# Patient Record
Sex: Female | Born: 1985 | Race: Black or African American | Hispanic: No | Marital: Married | Smoking: Never smoker
Health system: Southern US, Community
[De-identification: ages and names within clinical notes are randomized; demographics above are authoritative.]

## PROBLEM LIST (undated history)

## (undated) HISTORY — PX: CHOLECYSTECTOMY: SHX55

---

## 2009-11-17 ENCOUNTER — Emergency Department (HOSPITAL_BASED_OUTPATIENT_CLINIC_OR_DEPARTMENT_OTHER): Admission: EM | Admit: 2009-11-17 | Discharge: 2009-11-17 | Payer: Self-pay | Admitting: Emergency Medicine

## 2011-09-23 ENCOUNTER — Emergency Department (INDEPENDENT_AMBULATORY_CARE_PROVIDER_SITE_OTHER): Payer: Self-pay

## 2011-09-23 ENCOUNTER — Encounter: Payer: Self-pay | Admitting: Emergency Medicine

## 2011-09-23 ENCOUNTER — Emergency Department (HOSPITAL_BASED_OUTPATIENT_CLINIC_OR_DEPARTMENT_OTHER)
Admission: EM | Admit: 2011-09-23 | Discharge: 2011-09-23 | Disposition: A | Discharge status: Home / Self Care | Payer: Self-pay | Attending: Emergency Medicine | Admitting: Emergency Medicine

## 2011-09-23 DIAGNOSIS — R062 Wheezing: Secondary | ICD-10-CM | POA: Insufficient documentation

## 2011-09-23 DIAGNOSIS — J209 Acute bronchitis, unspecified: Secondary | ICD-10-CM | POA: Insufficient documentation

## 2011-09-23 DIAGNOSIS — R05 Cough: Secondary | ICD-10-CM

## 2011-09-23 DIAGNOSIS — R0602 Shortness of breath: Secondary | ICD-10-CM | POA: Insufficient documentation

## 2011-09-23 DIAGNOSIS — IMO0001 Reserved for inherently not codable concepts without codable children: Secondary | ICD-10-CM | POA: Insufficient documentation

## 2011-09-23 DIAGNOSIS — R0902 Hypoxemia: Secondary | ICD-10-CM

## 2011-09-23 MED ORDER — PREDNISONE 10 MG PO TABS
60.0000 mg | ORAL_TABLET | Freq: Once | ORAL | Status: AC
  Administered 2011-09-23: 60 mg via ORAL
  Filled 2011-09-23: qty 1

## 2011-09-23 MED ORDER — ALBUTEROL SULFATE HFA 108 (90 BASE) MCG/ACT IN AERS
2.0000 | INHALATION_SPRAY | Freq: Once | RESPIRATORY_TRACT | Status: AC
  Administered 2011-09-23: 2 via RESPIRATORY_TRACT
  Filled 2011-09-23: qty 6.7

## 2011-09-23 MED ORDER — AZITHROMYCIN 250 MG PO TABS: 250.0000 mg | ORAL_TABLET | Freq: Every day | ORAL | Status: AC

## 2011-09-23 MED ORDER — ALBUTEROL SULFATE HFA 108 (90 BASE) MCG/ACT IN AERS: 2.0000 | INHALATION_SPRAY | RESPIRATORY_TRACT | Status: DC | PRN

## 2011-09-23 MED ORDER — PREDNISONE 20 MG PO TABS: 40.0000 mg | ORAL_TABLET | Freq: Every day | ORAL | Status: AC

## 2011-09-23 NOTE — ED Notes (Signed)
Pt improved with treatment no wheezing noted benefits and Use of meds reviewed

## 2011-09-23 NOTE — ED Notes (Signed)
MD at bedside.Dr Fredricka Bonine at side care reviewed

## 2011-09-23 NOTE — ED Notes (Signed)
Pt verbalizes care plan well

## 2011-09-23 NOTE — ED Provider Notes (Signed)
History     CSN: [id number] Arrival date & time: 09/23/2011  7:42 AM   First MD Initiated Contact with Patient 09/23/11 [phone]      Chief Complaint  Patient presents with  . Wheezing    cough wheezing and cold last PM    (Consider location/radiation/quality/duration/timing/severity/associated sxs/prior treatment) HPI Comments: The patient is a 25 year old female who presents for evaluation of wheezing and cough which have been gradually progressive over the last 3 days, with a nonproductive cough, but with wheezing, worse at night, causing her to feel short of breath and interfering with her sleep. She denies ear pain, sore throat, nasal congestion, fever, or chills. She has no history of asthma or reactive airway disease.  Patient is a 25 y.o. female presenting with wheezing. The history is provided by the patient.  Wheezing  The current episode started 3 to 5 days ago. The onset was gradual. The problem occurs frequently. The problem has been unchanged. The problem is mild. The symptoms are relieved by nothing. The symptoms are aggravated by nothing. Associated symptoms include cough, shortness of breath and wheezing. Pertinent negatives include no chest pain, no chest pressure, no orthopnea, no fever, no rhinorrhea, no sore throat and no stridor. The cough has no precipitants. The cough is non-productive. Nothing relieves the cough. Nothing worsens the cough. There was no intake of a foreign body. She has not inhaled smoke recently. She has had no prior steroid use. Her past medical history does not include asthma or past wheezing. She has been behaving normally.    History reviewed. No pertinent past medical history.  Past Surgical History  Procedure Date  . Cholecystectomy     History reviewed. No pertinent family history.  History  Substance Use Topics  . Smoking status: Never Smoker   . Smokeless tobacco: Not on file  . Alcohol Use: No    OB History    Grav Para Term  Preterm Abortions TAB SAB Ect Mult Living                  Review of Systems  Constitutional: Positive for fatigue. Negative for fever, chills, diaphoresis, activity change, appetite change and unexpected weight change.  HENT: Negative for hearing loss, ear pain, nosebleeds, congestion, sore throat, facial swelling, rhinorrhea, sneezing, drooling, mouth sores, trouble swallowing, neck pain, neck stiffness, dental problem, voice change, postnasal drip, sinus pressure, tinnitus and ear discharge.   Eyes: Negative for photophobia, pain, discharge, redness and itching.  Respiratory: Positive for cough, shortness of breath and wheezing. Negative for choking, chest tightness and stridor.   Cardiovascular: Negative for chest pain, palpitations, orthopnea and leg swelling.  Gastrointestinal: Negative for nausea, vomiting, abdominal pain, diarrhea, constipation, blood in stool, abdominal distention and anal bleeding.  Genitourinary: Negative for dysuria, urgency, frequency, hematuria, flank pain and difficulty urinating.  Musculoskeletal: Positive for myalgias. Negative for back pain, joint swelling, arthralgias and gait problem.  Skin: Negative for color change, pallor, rash and wound.  Neurological: Negative for dizziness, weakness, light-headedness and headaches.  Hematological: Negative for adenopathy. Does not bruise/bleed easily.  Psychiatric/Behavioral: Negative.     Allergies  Review of patient's allergies indicates no known allergies.  Home Medications  No current outpatient prescriptions on file.  BP 158/98  Pulse 88  Temp 97.6 F (36.4 C)  Resp 24  Wt 188 lb (85.276 kg)  SpO2 94%  Physical Exam  Nursing note and vitals reviewed. Constitutional: She is oriented to person, place, and time. She appears  well-developed and well-nourished. No distress.  HENT:  Head: Normocephalic and atraumatic.  Right Ear: Hearing, tympanic membrane, external ear and ear canal normal.  Left Ear:  Hearing, tympanic membrane, external ear and ear canal normal.  Nose: Nose normal.  Mouth/Throat: Uvula is midline, oropharynx is clear and moist and mucous membranes are normal. No oropharyngeal exudate.  Eyes: Conjunctivae and EOM are normal. Pupils are equal, round, and reactive to light.  Neck: Normal range of motion.  Cardiovascular: Normal rate, regular rhythm, normal heart sounds and intact distal pulses.  Exam reveals no gallop and no friction rub.   No murmur heard. Pulmonary/Chest: Effort normal. No accessory muscle usage. Not tachypneic. No respiratory distress. She has no decreased breath sounds. She has wheezes in the right upper field and the left upper field. She has no rhonchi. She has no rales. She exhibits no tenderness.  Abdominal: Soft. Bowel sounds are normal. She exhibits no distension. There is no tenderness. There is no rebound and no guarding.  Musculoskeletal: Normal range of motion. She exhibits no edema and no tenderness.  Neurological: She is alert and oriented to person, place, and time. She has normal reflexes. No cranial nerve deficit. She exhibits normal muscle tone. Coordination normal.  Skin: Skin is warm and dry. No rash noted. She is not diaphoretic. No erythema. No pallor.  Psychiatric: She has a normal mood and affect. Her behavior is normal. Judgment and thought content normal.    ED Course  Procedures (including critical care time)  Labs Reviewed - No data to display No results found.   No diagnosis found.    MDM  The patient has mild adnexal tori wheezing bilaterally posteriorly on auscultation of the chest. She also has oxygen saturations of 93-94% on room air which seems low for her age and otherwise general condition. With the symptoms of the cough and the wheezing I will treat her with a steroid and beta agonist inhaler for bronchospasm that I think is a an acute bronchitis. I will also obtain a chest x-ray to evaluate for pneumonia. Otherwise  this may be a viral upper respiratory tract infection.       Felisa Bonier, MD 09/23/11 [phone]

## 2011-09-23 NOTE — ED Notes (Signed)
Pt presents with cold symptoms x 3 days with wheezing last PM denies Hx of asthma

## 2012-09-28 ENCOUNTER — Emergency Department (HOSPITAL_BASED_OUTPATIENT_CLINIC_OR_DEPARTMENT_OTHER)
Admission: EM | Admit: 2012-09-28 | Discharge: 2012-09-28 | Disposition: A | Discharge status: Home / Self Care | Payer: Self-pay | Attending: Emergency Medicine | Admitting: Emergency Medicine

## 2012-09-28 ENCOUNTER — Emergency Department (HOSPITAL_BASED_OUTPATIENT_CLINIC_OR_DEPARTMENT_OTHER): Payer: Self-pay

## 2012-09-28 ENCOUNTER — Encounter (HOSPITAL_BASED_OUTPATIENT_CLINIC_OR_DEPARTMENT_OTHER): Payer: Self-pay | Admitting: Emergency Medicine

## 2012-09-28 DIAGNOSIS — R51 Headache: Secondary | ICD-10-CM | POA: Insufficient documentation

## 2012-09-28 DIAGNOSIS — J3489 Other specified disorders of nose and nasal sinuses: Secondary | ICD-10-CM | POA: Insufficient documentation

## 2012-09-28 DIAGNOSIS — R0602 Shortness of breath: Secondary | ICD-10-CM | POA: Insufficient documentation

## 2012-09-28 DIAGNOSIS — J40 Bronchitis, not specified as acute or chronic: Secondary | ICD-10-CM | POA: Insufficient documentation

## 2012-09-28 MED ORDER — ALBUTEROL SULFATE HFA 108 (90 BASE) MCG/ACT IN AERS
INHALATION_SPRAY | RESPIRATORY_TRACT | Status: AC
  Administered 2012-09-28: 2 via RESPIRATORY_TRACT
  Filled 2012-09-28: qty 6.7

## 2012-09-28 MED ORDER — GUAIFENESIN-CODEINE 100-10 MG/5ML PO SYRP: 5.0000 mL | ORAL_SOLUTION | Freq: Three times a day (TID) | ORAL | Status: DC | PRN

## 2012-09-28 MED ORDER — ALBUTEROL SULFATE HFA 108 (90 BASE) MCG/ACT IN AERS
2.0000 | INHALATION_SPRAY | RESPIRATORY_TRACT | Status: DC | PRN
  Administered 2012-09-28: 2 via RESPIRATORY_TRACT

## 2012-09-28 NOTE — Discharge Instructions (Signed)

## 2012-09-28 NOTE — ED Notes (Signed)
Pt c/o cough, wheezing, chest tightness, and headache.  Non productive cough, no known fever.

## 2012-09-28 NOTE — ED Provider Notes (Signed)
History     CSN: [id number]  Arrival date & time 09/28/12  1048   First MD Initiated Contact with Patient 09/28/12 1103      Chief Complaint  Patient presents with  . Cough  . Shortness of Breath  . Pleurisy  . Headache    (Consider location/radiation/quality/duration/timing/severity/associated sxs/prior treatment) HPI Comments: Patient presents with a two-day history of worsening nasal congestion cough and wheezing. She states that she feels short of breath at times. Her cough is nonproductive. She has no known fevers. She denies any nausea or vomiting. Denies any chest pain other than soreness in the Center of her chest with coughing. Denies any leg pain or swelling. She's been taking Benadryl without relief.  Patient is a 26 y.o. female presenting with cough, shortness of breath, and headaches.  Cough Associated symptoms include headaches, shortness of breath and wheezing. Pertinent negatives include no chest pain, no chills and no rhinorrhea.  Shortness of Breath  Associated symptoms include cough, shortness of breath and wheezing. Pertinent negatives include no chest pain, no fever and no rhinorrhea.  Headache  Associated symptoms include shortness of breath. Pertinent negatives include no fever, no nausea and no vomiting.    No past medical history on file.  Past Surgical History  Procedure Date  . Cholecystectomy     No family history on file.  History  Substance Use Topics  . Smoking status: Never Smoker   . Smokeless tobacco: Not on file  . Alcohol Use: No    OB History    Grav Para Term Preterm Abortions TAB SAB Ect Mult Living                  Review of Systems  Constitutional: Positive for fatigue. Negative for fever, chills and diaphoresis.  HENT: Positive for congestion and postnasal drip. Negative for rhinorrhea and sneezing.   Eyes: Negative.   Respiratory: Positive for cough, shortness of breath and wheezing. Negative for chest tightness.     Cardiovascular: Negative for chest pain and leg swelling.  Gastrointestinal: Negative for nausea, vomiting, abdominal pain, diarrhea and blood in stool.  Genitourinary: Negative for frequency, hematuria, flank pain and difficulty urinating.  Musculoskeletal: Negative for back pain and arthralgias.  Skin: Negative for rash.  Neurological: Positive for headaches. Negative for dizziness, speech difficulty, weakness and numbness.    Allergies  Review of patient's allergies indicates no known allergies.  Home Medications   Current Outpatient Rx  Name  Route  Sig  Dispense  Refill  . GUAIFENESIN-CODEINE 100-10 MG/5ML PO SYRP   Oral   Take 5 mLs by mouth 3 (three) times daily as needed for cough.   120 mL   0     BP 126/82  Pulse 97  Temp 98.4 F (36.9 C) (Oral)  Resp 16  Ht 4' (1.219 m)  Wt 194 lb (87.998 kg)  BMI 59.20 kg/m2  SpO2 100%  LMP 09/02/2012  Physical Exam  Constitutional: She is oriented to person, place, and time. She appears well-developed and well-nourished.  HENT:  Head: Normocephalic and atraumatic.  Right Ear: External ear normal.  Left Ear: External ear normal.  Mouth/Throat: Oropharynx is clear and moist.  Eyes: Pupils are equal, round, and reactive to light.  Neck: Normal range of motion. Neck supple.  Cardiovascular: Normal rate, regular rhythm and normal heart sounds.   Pulmonary/Chest: Effort normal. No respiratory distress. She has wheezes (few scattered expiratory wheezes). She has no rales. She exhibits no tenderness.  Mildly decreased breath sounds bilaterally   Abdominal: Soft. Bowel sounds are normal. There is no tenderness. There is no rebound and no guarding.  Musculoskeletal: Normal range of motion. She exhibits no edema.  Lymphadenopathy:    She has no cervical adenopathy.  Neurological: She is alert and oriented to person, place, and time.  Skin: Skin is warm and dry. No rash noted.  Psychiatric: She has a normal mood and  affect.    ED Course  Procedures (including critical care time)  Labs Reviewed - No data to display Dg Chest 2 View  09/28/2012  *RADIOLOGY REPORT*  Clinical Data: Cough and wheezing  CHEST - 2 VIEW  Comparison: Chest radiograph 09/23/2011  Findings: Normal heart, mediastinal, and hilar contours.  Normal pulmonary vascularity.  Lungs well expanded and clear.  Trachea midline.  Approximately 25 degrees convex right curvature of the mid thoracic spine is present.  This curvature is unchanged.  IMPRESSION:  1.  No acute cardiopulmonary disease. 2.  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   Original Report Authenticated By: Britta Mccreedy, M.D.      1. Bronchitis       MDM  Patient is well-appearing with no evidence of pneumonia. Oxygen saturations are normal. Will be treated with an albuterol MDI which she was given here in emergency department and had some improvement with using this. Was also given a prescription for cough medicine. Was advised to followup with her primary care physician or return here as needed for any ongoing or worsening symptoms.        Rolan Bucco, MD 09/28/12 1229

## 2012-09-28 NOTE — ED Notes (Signed)
Patient transported to X-ray 

## 2012-12-04 ENCOUNTER — Encounter (HOSPITAL_BASED_OUTPATIENT_CLINIC_OR_DEPARTMENT_OTHER): Payer: Self-pay | Admitting: *Deleted

## 2012-12-04 ENCOUNTER — Emergency Department (HOSPITAL_BASED_OUTPATIENT_CLINIC_OR_DEPARTMENT_OTHER)
Admission: EM | Admit: 2012-12-04 | Discharge: 2012-12-04 | Disposition: A | Discharge status: Home / Self Care | Payer: Self-pay | Attending: Emergency Medicine | Admitting: Emergency Medicine

## 2012-12-04 DIAGNOSIS — K529 Noninfective gastroenteritis and colitis, unspecified: Secondary | ICD-10-CM

## 2012-12-04 DIAGNOSIS — R197 Diarrhea, unspecified: Secondary | ICD-10-CM | POA: Insufficient documentation

## 2012-12-04 DIAGNOSIS — K5289 Other specified noninfective gastroenteritis and colitis: Secondary | ICD-10-CM | POA: Insufficient documentation

## 2012-12-04 LAB — URINE MICROSCOPIC-ADD ON

## 2012-12-04 LAB — URINALYSIS, ROUTINE W REFLEX MICROSCOPIC
Glucose, UA: NEGATIVE mg/dL
Leukocytes, UA: NEGATIVE
pH: 5 (ref 5.0–8.0)

## 2012-12-04 LAB — BASIC METABOLIC PANEL
CO2: 23 mEq/L (ref 19–32)
Calcium: 9.5 mg/dL (ref 8.4–10.5)
Chloride: 101 mEq/L (ref 96–112)
GFR calc Af Amer: >90 mL/min (ref >90)
Sodium: 138 mEq/L (ref 135–145)

## 2012-12-04 LAB — PREGNANCY, URINE: Preg Test, Ur: NEGATIVE

## 2012-12-04 MED ORDER — ONDANSETRON HCL 4 MG/2ML IJ SOLN
4.0000 mg | Freq: Once | INTRAMUSCULAR | Status: AC
  Administered 2012-12-04: 4 mg via INTRAVENOUS
  Filled 2012-12-04: qty 2

## 2012-12-04 MED ORDER — SODIUM CHLORIDE 0.9 % IV BOLUS (SEPSIS)
1000.0000 mL | Freq: Once | INTRAVENOUS | Status: AC
  Administered 2012-12-04: 1000 mL via INTRAVENOUS

## 2012-12-04 MED ORDER — SODIUM CHLORIDE 0.9 % IV SOLN: INTRAVENOUS | Status: DC

## 2012-12-04 MED ORDER — DIPHENOXYLATE-ATROPINE 2.5-0.025 MG PO TABS: 1.0000 | ORAL_TABLET | Freq: Four times a day (QID) | ORAL | Status: DC | PRN

## 2012-12-04 MED ORDER — LOPERAMIDE HCL 2 MG PO CAPS: 2.0000 mg | ORAL_CAPSULE | ORAL | Status: DC | PRN

## 2012-12-04 MED ORDER — ONDANSETRON HCL 4 MG PO TABS: 4.0000 mg | ORAL_TABLET | Freq: Four times a day (QID) | ORAL | Status: DC

## 2012-12-04 NOTE — ED Provider Notes (Signed)
History     CSN: [id number]  Arrival date & time 12/04/12  1722   First MD Initiated Contact with Patient 12/04/12 1733      Chief Complaint  Patient presents with  . Emesis    (Consider location/radiation/quality/duration/timing/severity/associated sxs/prior treatment) Patient is a 27 y.o. female presenting with vomiting. The history is provided by the patient.  Emesis   pt here with 12 hours of non-bilious vomiting a/w watery diarrhea--no sore throat or ear pain--no cough or congestion--no fever,rash chills, or myalgias--positive sick contacts--no tx done pta  History reviewed. No pertinent past medical history.  Past Surgical History  Procedure Date  . Cholecystectomy     History reviewed. No pertinent family history.  History  Substance Use Topics  . Smoking status: Never Smoker   . Smokeless tobacco: Not on file  . Alcohol Use: No    OB History    Grav Para Term Preterm Abortions TAB SAB Ect Mult Living                  Review of Systems  Gastrointestinal: Positive for vomiting.  All other systems reviewed and are negative.    Allergies  Review of patient's allergies indicates no known allergies.  Home Medications   Current Outpatient Rx  Name  Route  Sig  Dispense  Refill  . GUAIFENESIN-CODEINE 100-10 MG/5ML PO SYRP   Oral   Take 5 mLs by mouth 3 (three) times daily as needed for cough.   120 mL   0     BP 124/79  Pulse 100  Temp 98.6 F (37 C) (Oral)  Resp 16  Ht 4\' 11"  (1.499 m)  Wt 194 lb (87.998 kg)  BMI 39.18 kg/m2  SpO2 100%  LMP 12/02/2012  Physical Exam  Nursing note and vitals reviewed. Constitutional: She is oriented to person, place, and time. She appears well-developed and well-nourished.  Non-toxic appearance. No distress.  HENT:  Head: Normocephalic and atraumatic.  Eyes: Conjunctivae normal, EOM and lids are normal. Pupils are equal, round, and reactive to light.  Neck: Normal range of motion. Neck supple. No tracheal  deviation present. No mass present.  Cardiovascular: Normal rate, regular rhythm and normal heart sounds.  Exam reveals no gallop.   No murmur heard. Pulmonary/Chest: Effort normal and breath sounds normal. No stridor. No respiratory distress. She has no decreased breath sounds. She has no wheezes. She has no rhonchi. She has no rales.  Abdominal: Soft. Normal appearance and bowel sounds are normal. She exhibits no distension. There is no tenderness. There is no rebound and no CVA tenderness.  Musculoskeletal: Normal range of motion. She exhibits no edema and no tenderness.  Neurological: She is alert and oriented to person, place, and time. She has normal strength. No cranial nerve deficit or sensory deficit. GCS eye subscore is 4. GCS verbal subscore is 5. GCS motor subscore is 6.  Skin: Skin is warm and dry. No abrasion and no rash noted.  Psychiatric: She has a normal mood and affect. Her speech is normal and behavior is normal.    ED Course  Procedures (including critical care time)   Labs Reviewed  URINALYSIS, ROUTINE W REFLEX MICROSCOPIC  BASIC METABOLIC PANEL   No results found.   No diagnosis found.    MDM  Patient given IV fluids and feels better. Patient likely viral gastroenteritis that she is sick exposures. Reexamined at time of discharge remained stable        Toy Baker,  MD 12/04/12 1939

## 2012-12-04 NOTE — ED Notes (Signed)
Pt c/o n/v/d x 12 hrs 

## 2012-12-04 NOTE — ED Notes (Signed)
Pt denies vomiting or diarrhea at this time, PO ice chips given.

## 2013-04-26 ENCOUNTER — Encounter (HOSPITAL_BASED_OUTPATIENT_CLINIC_OR_DEPARTMENT_OTHER): Payer: Self-pay | Admitting: *Deleted

## 2013-04-26 ENCOUNTER — Emergency Department (HOSPITAL_BASED_OUTPATIENT_CLINIC_OR_DEPARTMENT_OTHER)
Admission: EM | Admit: 2013-04-26 | Discharge: 2013-04-26 | Disposition: A | Discharge status: Home / Self Care | Payer: BC Managed Care – PPO | Attending: Emergency Medicine | Admitting: Emergency Medicine

## 2013-04-26 DIAGNOSIS — W57XXXA Bitten or stung by nonvenomous insect and other nonvenomous arthropods, initial encounter: Secondary | ICD-10-CM

## 2013-04-26 DIAGNOSIS — R51 Headache: Secondary | ICD-10-CM | POA: Insufficient documentation

## 2013-04-26 DIAGNOSIS — IMO0001 Reserved for inherently not codable concepts without codable children: Secondary | ICD-10-CM | POA: Insufficient documentation

## 2013-04-26 DIAGNOSIS — Y9389 Activity, other specified: Secondary | ICD-10-CM | POA: Insufficient documentation

## 2013-04-26 DIAGNOSIS — Y929 Unspecified place or not applicable: Secondary | ICD-10-CM | POA: Insufficient documentation

## 2013-04-26 MED ORDER — IBUPROFEN 800 MG PO TABS: 800.0000 mg | ORAL_TABLET | Freq: Three times a day (TID) | ORAL | Status: DC

## 2013-04-26 MED ORDER — KETOROLAC TROMETHAMINE 60 MG/2ML IM SOLN
60.0000 mg | Freq: Once | INTRAMUSCULAR | Status: AC
  Administered 2013-04-26: 60 mg via INTRAMUSCULAR
  Filled 2013-04-26: qty 2

## 2013-04-26 NOTE — ED Notes (Signed)
Pt reports ?insect bite on left arm x 1 day- also c/o headache and neck soreness

## 2013-04-26 NOTE — ED Notes (Signed)
PA at bedside.

## 2013-04-26 NOTE — ED Provider Notes (Signed)
History    CSN: [id number] Arrival date & time 04/26/13  2303  First MD Initiated Contact with Patient 04/26/13 2319     Chief Complaint  Patient presents with  . Insect Bite   (Consider location/radiation/quality/duration/timing/severity/associated sxs/prior Treatment) HPI Cheryl Washington is a 27 y.o. female who presents to ed with complaint of a red swollen area to the left arm, onset yesterday, and a headache onset today. Pt states she believes area on her arm is an insect bite. States it is itchy. States headache began today while at a wedding. Hx of headaches in the past similar to this one, usually takes ibuprofen, however states she did not take any medications today because was coming straight from the wedding. Denies fever, neck pain or stiffness, nausea, vomiting, malaise. No visual changes or photophobia.  History reviewed. No pertinent past medical history. Past Surgical History  Procedure Laterality Date  . Cholecystectomy     No family history on file. History  Substance Use Topics  . Smoking status: Never Smoker   . Smokeless tobacco: Never Used  . Alcohol Use: 0.5 oz/week    1 drink(s) per week   OB History   Grav Para Term Preterm Abortions TAB SAB Ect Mult Living                 Review of Systems  Constitutional: Negative for fever and chills.  HENT: Negative for congestion, sore throat, neck pain, neck stiffness and sinus pressure.   Respiratory: Negative.   Cardiovascular: Negative.   Genitourinary: Negative for dysuria.  Skin: Positive for color change and wound.  Neurological: Negative for dizziness and numbness.    Allergies  Review of patient's allergies indicates no known allergies.  Home Medications   Current Outpatient Rx  Name  Route  Sig  Dispense  Refill  . diphenoxylate-atropine (LOMOTIL) 2.5-0.025 MG per tablet   Oral   Take 1 tablet by mouth 4 (four) times daily as needed for diarrhea or loose stools.   30 tablet   0   .  guaiFENesin-codeine (ROBITUSSIN AC) 100-10 MG/5ML syrup   Oral   Take 5 mLs by mouth 3 (three) times daily as needed for cough.   120 mL   0   . ondansetron (ZOFRAN) 4 MG tablet   Oral   Take 1 tablet (4 mg total) by mouth every 6 (six) hours.   12 tablet   0    BP 116/81  Pulse 98  Temp(Src) 98.6 F (37 C) (Oral)  Ht 4\' 11"  (1.499 m)  Wt 184 lb (83.462 kg)  BMI 37.14 kg/m2  SpO2 100%  LMP 04/20/2013 Physical Exam  Nursing note and vitals reviewed. Constitutional: She is oriented to person, place, and time. She appears well-developed and well-nourished. No distress.  HENT:  Head: Normocephalic.  Eyes: Conjunctivae and EOM are normal. Pupils are equal, round, and reactive to light.  Neck: Normal range of motion. Neck supple.  Cardiovascular: Normal rate, regular rhythm and normal heart sounds.   Pulmonary/Chest: Effort normal and breath sounds normal. No respiratory distress. She has no wheezes.  Abdominal: Soft. Bowel sounds are normal. She exhibits no distension. There is no tenderness. There is no rebound.  Lymphadenopathy:    She has no cervical adenopathy.  Neurological: She is alert and oriented to person, place, and time. No cranial nerve deficit.  5/5 and equal upper and lower extremity strength bilaterally. Equal grip strength bilaterally. Normal finger to nose and heel to shin. No  pronator drift.   Skin: Skin is warm and dry.  Left arm area of erythema with central papule. Area about 3cm in diameter. Surrounding excoriations.     ED Course  Procedures (including critical care time) Labs Reviewed - No data to display No results found.  1. Insect bite   2. Headache     MDM  Pt with headache that is similar to prior, neurovascularly intact. Gradual in onset. No photophobia. No fever. Pt's arm erythema is non tender, it is itchy. Most likely insect bite. Will treat with toradol in ED, ibuprofen at home for headache. Hydrocortisone cream at home. Benadryl. Follow  up as needed.   Filed Vitals:   04/26/13 2311  BP: 116/81  Pulse: 98  Temp: 98.6 F (37 C)  TempSrc: Oral  Height: 4\' 11"  (1.499 m)  Weight: 184 lb (83.462 kg)  SpO2: 100%     Floyd Lusignan A Yvon Mccord, PA-C 04/27/13 0030

## 2013-04-27 NOTE — ED Provider Notes (Signed)
Medical screening examination/treatment/procedure(s) were performed by non-physician practitioner and as supervising physician I was immediately available for consultation/collaboration.  Jasmine Awe, MD 04/27/13 [phone]

## 2014-03-21 ENCOUNTER — Emergency Department (HOSPITAL_BASED_OUTPATIENT_CLINIC_OR_DEPARTMENT_OTHER)
Admission: EM | Admit: 2014-03-21 | Discharge: 2014-03-22 | Disposition: A | Discharge status: Home / Self Care | Payer: No Typology Code available for payment source | Attending: Emergency Medicine | Admitting: Emergency Medicine

## 2014-03-21 ENCOUNTER — Encounter (HOSPITAL_BASED_OUTPATIENT_CLINIC_OR_DEPARTMENT_OTHER): Payer: Self-pay | Admitting: Emergency Medicine

## 2014-03-21 DIAGNOSIS — Z3202 Encounter for pregnancy test, result negative: Secondary | ICD-10-CM | POA: Insufficient documentation

## 2014-03-21 DIAGNOSIS — R509 Fever, unspecified: Secondary | ICD-10-CM | POA: Insufficient documentation

## 2014-03-21 DIAGNOSIS — R112 Nausea with vomiting, unspecified: Secondary | ICD-10-CM | POA: Insufficient documentation

## 2014-03-21 DIAGNOSIS — R1110 Vomiting, unspecified: Secondary | ICD-10-CM

## 2014-03-21 DIAGNOSIS — Z791 Long term (current) use of non-steroidal anti-inflammatories (NSAID): Secondary | ICD-10-CM | POA: Insufficient documentation

## 2014-03-21 DIAGNOSIS — R1013 Epigastric pain: Secondary | ICD-10-CM | POA: Insufficient documentation

## 2014-03-21 DIAGNOSIS — R1084 Generalized abdominal pain: Secondary | ICD-10-CM | POA: Insufficient documentation

## 2014-03-21 LAB — URINALYSIS, ROUTINE W REFLEX MICROSCOPIC
Bilirubin Urine: NEGATIVE
Glucose, UA: NEGATIVE mg/dL
Hgb urine dipstick: NEGATIVE
KETONES UR: NEGATIVE mg/dL
LEUKOCYTES UA: NEGATIVE
NITRITE: NEGATIVE
PH: 6 (ref 5.0–8.0)
PROTEIN: 30 mg/dL — AB
Specific Gravity, Urine: 1.023 (ref 1.005–1.030)
Urobilinogen, UA: 0.2 mg/dL (ref 0.0–1.0)

## 2014-03-21 LAB — URINE MICROSCOPIC-ADD ON

## 2014-03-21 LAB — PREGNANCY, URINE: PREG TEST UR: NEGATIVE

## 2014-03-21 MED ORDER — PROMETHAZINE HCL 25 MG/ML IJ SOLN
12.5000 mg | Freq: Once | INTRAMUSCULAR | Status: AC
  Administered 2014-03-21: 12.5 mg via INTRAVENOUS
  Filled 2014-03-21: qty 1

## 2014-03-21 MED ORDER — ACETAMINOPHEN 325 MG PO TABS
ORAL_TABLET | ORAL | Status: AC
  Filled 2014-03-21: qty 1

## 2014-03-21 MED ORDER — PROMETHAZINE HCL 25 MG RE SUPP: 25.0000 mg | Freq: Four times a day (QID) | RECTAL | Status: DC | PRN

## 2014-03-21 MED ORDER — ACETAMINOPHEN 325 MG PO TABS
650.0000 mg | ORAL_TABLET | Freq: Once | ORAL | Status: AC
  Administered 2014-03-21: 650 mg via ORAL
  Filled 2014-03-21: qty 2

## 2014-03-21 MED ORDER — SODIUM CHLORIDE 0.9 % IV BOLUS (SEPSIS)
1000.0000 mL | Freq: Once | INTRAVENOUS | Status: AC
  Administered 2014-03-21: 1000 mL via INTRAVENOUS

## 2014-03-21 NOTE — ED Provider Notes (Signed)
CSN: [id number]     Arrival date & time 03/21/14  1733 History   First MD Initiated Contact with Patient 03/21/14 1851     Chief Complaint  Patient presents with  . Vomiting     (Consider location/radiation/quality/duration/timing/severity/associated sxs/prior Treatment) Patient is a 28 y.o. female presenting with vomiting. The history is provided by the patient. No language interpreter was used.  Emesis Severity:  Moderate Associated symptoms: abdominal pain   Associated symptoms: no diarrhea and no myalgias   Associated symptoms comment:  Vomiting started this morning without fever and has persisted all day. No diarrhea. She states she can't tolerate fluids or solids. She has cramping abdominal discomfort sporadically affecting the epigastrium.    History reviewed. No pertinent past medical history. Past Surgical History  Procedure Laterality Date  . Cholecystectomy     No family history on file. History  Substance Use Topics  . Smoking status: Never Smoker   . Smokeless tobacco: Never Used  . Alcohol Use: 0.5 oz/week    1 drink(s) per week   OB History   Grav Para Term Preterm Abortions TAB SAB Ect Mult Living                 Review of Systems  Constitutional: Negative for fatigue.  Respiratory: Negative for shortness of breath.   Cardiovascular: Negative for chest pain.  Gastrointestinal: Positive for nausea, vomiting and abdominal pain. Negative for diarrhea and blood in stool.  Genitourinary: Negative for dysuria.  Musculoskeletal: Negative for myalgias.  Skin: Negative for rash.      Allergies  Review of patient's allergies indicates no known allergies.  Home Medications   Prior to Admission medications   Medication Sig Start Date End Date Taking? Authorizing Provider  diphenoxylate-atropine (LOMOTIL) 2.5-0.025 MG per tablet Take 1 tablet by mouth 4 (four) times daily as needed for diarrhea or loose stools. 12/04/12   Toy Baker, MD   guaiFENesin-codeine [facility]) 100-10 MG/5ML syrup Take 5 mLs by mouth 3 (three) times daily as needed for cough. 09/28/12   Rolan Bucco, MD  ibuprofen (ADVIL,MOTRIN) 800 MG tablet Take 1 tablet (800 mg total) by mouth 3 (three) times daily. 04/26/13   Tatyana A Kirichenko, PA-C  ondansetron (ZOFRAN) 4 MG tablet Take 1 tablet (4 mg total) by mouth every 6 (six) hours. 12/04/12   Toy Baker, MD   BP 130/85  Pulse 107  Temp(Src) 98.5 F (36.9 C) (Oral)  Resp 22  Ht 5' (1.524 m)  Wt 190 lb (86.183 kg)  BMI 37.11 kg/m2  SpO2 99% Physical Exam  Constitutional: She is oriented to person, place, and time. She appears well-developed and well-nourished.  HENT:  Head: Normocephalic.  Neck: Normal range of motion. Neck supple.  Cardiovascular: Normal rate and regular rhythm.   Pulmonary/Chest: Effort normal and breath sounds normal.  Abdominal: Soft. Bowel sounds are normal. There is tenderness. There is no rebound and no guarding.  Mild diffuse abdominal tenderness.   Musculoskeletal: Normal range of motion.  Neurological: She is alert and oriented to person, place, and time.  Skin: Skin is warm and dry. No rash noted.  Psychiatric: She has a normal mood and affect.    ED Course  Procedures (including critical care time) Labs Review Labs Reviewed  URINALYSIS, ROUTINE W REFLEX MICROSCOPIC - Abnormal; Notable for the following:    Protein, ur 30 (*)    All other components within normal limits  URINE MICROSCOPIC-ADD ON - Abnormal; Notable for the  following:    Squamous Epithelial / LPF FEW (*)    Bacteria, UA MANY (*)    Casts HYALINE CASTS (*)    All other components within normal limits  PREGNANCY, URINE    Imaging Review No results found.   EKG Interpretation None      MDM   Final diagnoses:  None    1. Vomiting   Vomiting controlled with IV antiemetics. She is drinking fluids without further vomiting. Abdomen is benign with mild epigastric tenderness.  DDx: viral GE vs food born illness. Symptomatic treatment required.  Feel she is well enough for discharge home to follow up with PCP in the next 3-4 days if symptoms persist.  11:40 - discharge delayed for recurrent vomiting and then persistent nausea with vomiting. She subsequently developed a fever. Abdomen benign. Tachycardia resolved with fluids prior to onset fever. On reassessment, no further vomiting. She tolerated the Tylenol without emesis. She states she feels she wants to go home. Strict return precautions given, including return if uncontrolled vomiting, bloody emesis, high fever, severe pain or bloody diarrhea.    Arnoldo HookerShari A Dynasia Kercheval, PA-C 03/21/14 2112  Arnoldo HookerShari A Georgina Krist, PA-C 03/21/14 2344

## 2014-03-21 NOTE — Discharge Instructions (Signed)

## 2014-03-21 NOTE — ED Notes (Signed)
Reports multiple episodes of vomiting all day today.  Unable to keep water/ginger ale down.  Also c/o abdominal pain (started prior to the vomiting), diarrhea, denies fever.

## 2014-03-21 NOTE — ED Provider Notes (Signed)
Medical screening examination/treatment/procedure(s) were performed by non-physician practitioner and as supervising physician I was immediately available for consultation/collaboration.   EKG Interpretation None        Tristan Proto, MD 03/21/14 2350 

## 2014-03-21 NOTE — ED Notes (Addendum)
rx x 1 given for phenergan- pt has a ride at bedside- medicated for fever prior to d/c

## 2014-03-23 LAB — URINE CULTURE

## 2015-11-29 ENCOUNTER — Encounter (HOSPITAL_BASED_OUTPATIENT_CLINIC_OR_DEPARTMENT_OTHER): Payer: Self-pay | Admitting: *Deleted

## 2015-11-29 ENCOUNTER — Emergency Department (HOSPITAL_BASED_OUTPATIENT_CLINIC_OR_DEPARTMENT_OTHER): Payer: No Typology Code available for payment source

## 2015-11-29 ENCOUNTER — Emergency Department (HOSPITAL_BASED_OUTPATIENT_CLINIC_OR_DEPARTMENT_OTHER)
Admission: EM | Admit: 2015-11-29 | Discharge: 2015-11-29 | Disposition: A | Discharge status: Home / Self Care | Payer: No Typology Code available for payment source | Attending: Emergency Medicine | Admitting: Emergency Medicine

## 2015-11-29 DIAGNOSIS — Z791 Long term (current) use of non-steroidal anti-inflammatories (NSAID): Secondary | ICD-10-CM | POA: Diagnosis not present

## 2015-11-29 DIAGNOSIS — R05 Cough: Secondary | ICD-10-CM | POA: Diagnosis present

## 2015-11-29 DIAGNOSIS — J209 Acute bronchitis, unspecified: Secondary | ICD-10-CM | POA: Diagnosis not present

## 2015-11-29 MED ORDER — ALBUTEROL SULFATE HFA 108 (90 BASE) MCG/ACT IN AERS
2.0000 | INHALATION_SPRAY | RESPIRATORY_TRACT | Status: DC | PRN
  Administered 2015-11-29: 2 via RESPIRATORY_TRACT
  Filled 2015-11-29: qty 6.7

## 2015-11-29 MED ORDER — HYDROCOD POLST-CPM POLST ER 10-8 MG/5ML PO SUER: 5.0000 mL | Freq: Two times a day (BID) | ORAL | Status: DC | PRN

## 2015-11-29 NOTE — ED Notes (Signed)
Returned from xray

## 2015-11-29 NOTE — ED Notes (Signed)
MD with pt  

## 2015-11-29 NOTE — ED Provider Notes (Signed)
CSN: [id number]     Arrival date & time 11/29/15  0205 History   First MD Initiated Contact with Patient 11/29/15 [phone]     Chief Complaint  Patient presents with  . Cough     (Consider location/radiation/quality/duration/timing/severity/associated sxs/prior Treatment) HPI  This is a 30 year old female with a three-day history of nasal congestion and sinus pressure. She was seen 2 days ago at an urgent care and placed on amoxicillin for suspected sinus infection. She has also been using Afrin for the nasal congestion. She is here with a productive cough and subjective shortness of breath that began yesterday. It is been productive of green sputum. The cough has been severe enough to keep her from sleeping. She does not know if she's had a fever. She denies chest pain.  History reviewed. No pertinent past medical history. Past Surgical History  Procedure Laterality Date  . Cholecystectomy     No family history on file. Social History  Substance Use Topics  . Smoking status: Never Smoker   . Smokeless tobacco: Never Used  . Alcohol Use: No   OB History    No data available     Review of Systems  All other systems reviewed and are negative.   Allergies  Review of patient's allergies indicates no known allergies.  Home Medications   Prior to Admission medications   Medication Sig Start Date End Date Taking? Authorizing Provider  diphenoxylate-atropine (LOMOTIL) 2.5-0.025 MG per tablet Take 1 tablet by mouth 4 (four) times daily as needed for diarrhea or loose stools. 12/04/12   Lorre Nick, MD  guaiFENesin-codeine [facility]) 100-10 MG/5ML syrup Take 5 mLs by mouth 3 (three) times daily as needed for cough. 09/28/12   Rolan Bucco, MD  ibuprofen (ADVIL,MOTRIN) 800 MG tablet Take 1 tablet (800 mg total) by mouth 3 (three) times daily. 04/26/13   Tatyana Kirichenko, PA-C  ondansetron (ZOFRAN) 4 MG tablet Take 1 tablet (4 mg total) by mouth every 6 (six) hours. 12/04/12   Lorre Nick, MD  promethazine (PHENERGAN) 25 MG suppository Place 1 suppository (25 mg total) rectally every 6 (six) hours as needed for nausea or vomiting. 03/21/14   Elpidio Anis, PA-C   BP 123/86 mmHg  Pulse 97  Temp(Src) 98.2 F (36.8 C) (Oral)  Resp 20  Ht  (1.499 m)  Wt 198 lb (89.812 kg)  BMI 39.97 kg/m2  SpO2 100%   Physical Exam  General: Well-developed, well-nourished female in no acute distress; appearance consistent with age of record HENT: normocephalic; atraumatic; no pain on percussion of sinuses; mild nasal congestion Eyes: pupils equal, round and reactive to light; extraocular muscles intact Neck: supple Heart: regular rate and rhythm Lungs: clear to auscultation bilaterally; occasional cough Abdomen: soft; nondistended; nontender; bowel sounds present Extremities: No deformity; full range of motion; pulses normal Neurologic: Awake, alert and oriented; motor function intact in all extremities and symmetric; no facial droop Skin: Warm and dry Psychiatric: Normal mood and affect    ED Course  Procedures (including critical care time)   MDM  Nursing notes and vitals signs, including pulse oximetry, reviewed.  Summary of this visit's results, reviewed by myself:  Imaging Studies: Dg Chest 2 View  11/29/2015  CLINICAL DATA:  Productive cough and shortness of breath. Onset yesterday. EXAM: CHEST  2 VIEW COMPARISON:  09/28/2012 FINDINGS: The cardiomediastinal contours are normal. Central bronchitic change, right greater than left. Pulmonary vasculature is normal. No [id number] effusion, or pneumothorax. Stable scoliotic curvature of  the midthoracic spine. No acute osseous abnormalities are seen. IMPRESSION: Central bronchitic change, right greater than left, may reflect acute bronchitis. No consolidation. Electronically Signed   By: Rubye Oaks M.D.   On: 11/29/2015 03:18      Paula Libra, MD 11/29/15 [phone]

## 2015-11-29 NOTE — ED Notes (Addendum)
Pt c/o prod cough green in color and feeling sob since yesterday. States she has felt "hot" fevers unknown. Pt states she was seen at [facility] on Sat and was prescribed amoxicillin for a sinus infection. Main concern is her cough and that she feels "stopped up" resp even and unlabored. Denies any cp.

## 2015-11-29 NOTE — ED Notes (Signed)
Pt in xray

## 2015-11-29 NOTE — Discharge Instructions (Signed)

## 2017-05-02 IMAGING — CR DG CHEST 2V
2 series · 2 of 2 positions shown · non-contrast
Comparison: 09/28/2012

CLINICAL DATA: Productive cough and shortness of breath. Onset
yesterday.

EXAM:
CHEST  2 VIEW

[w chest pa]
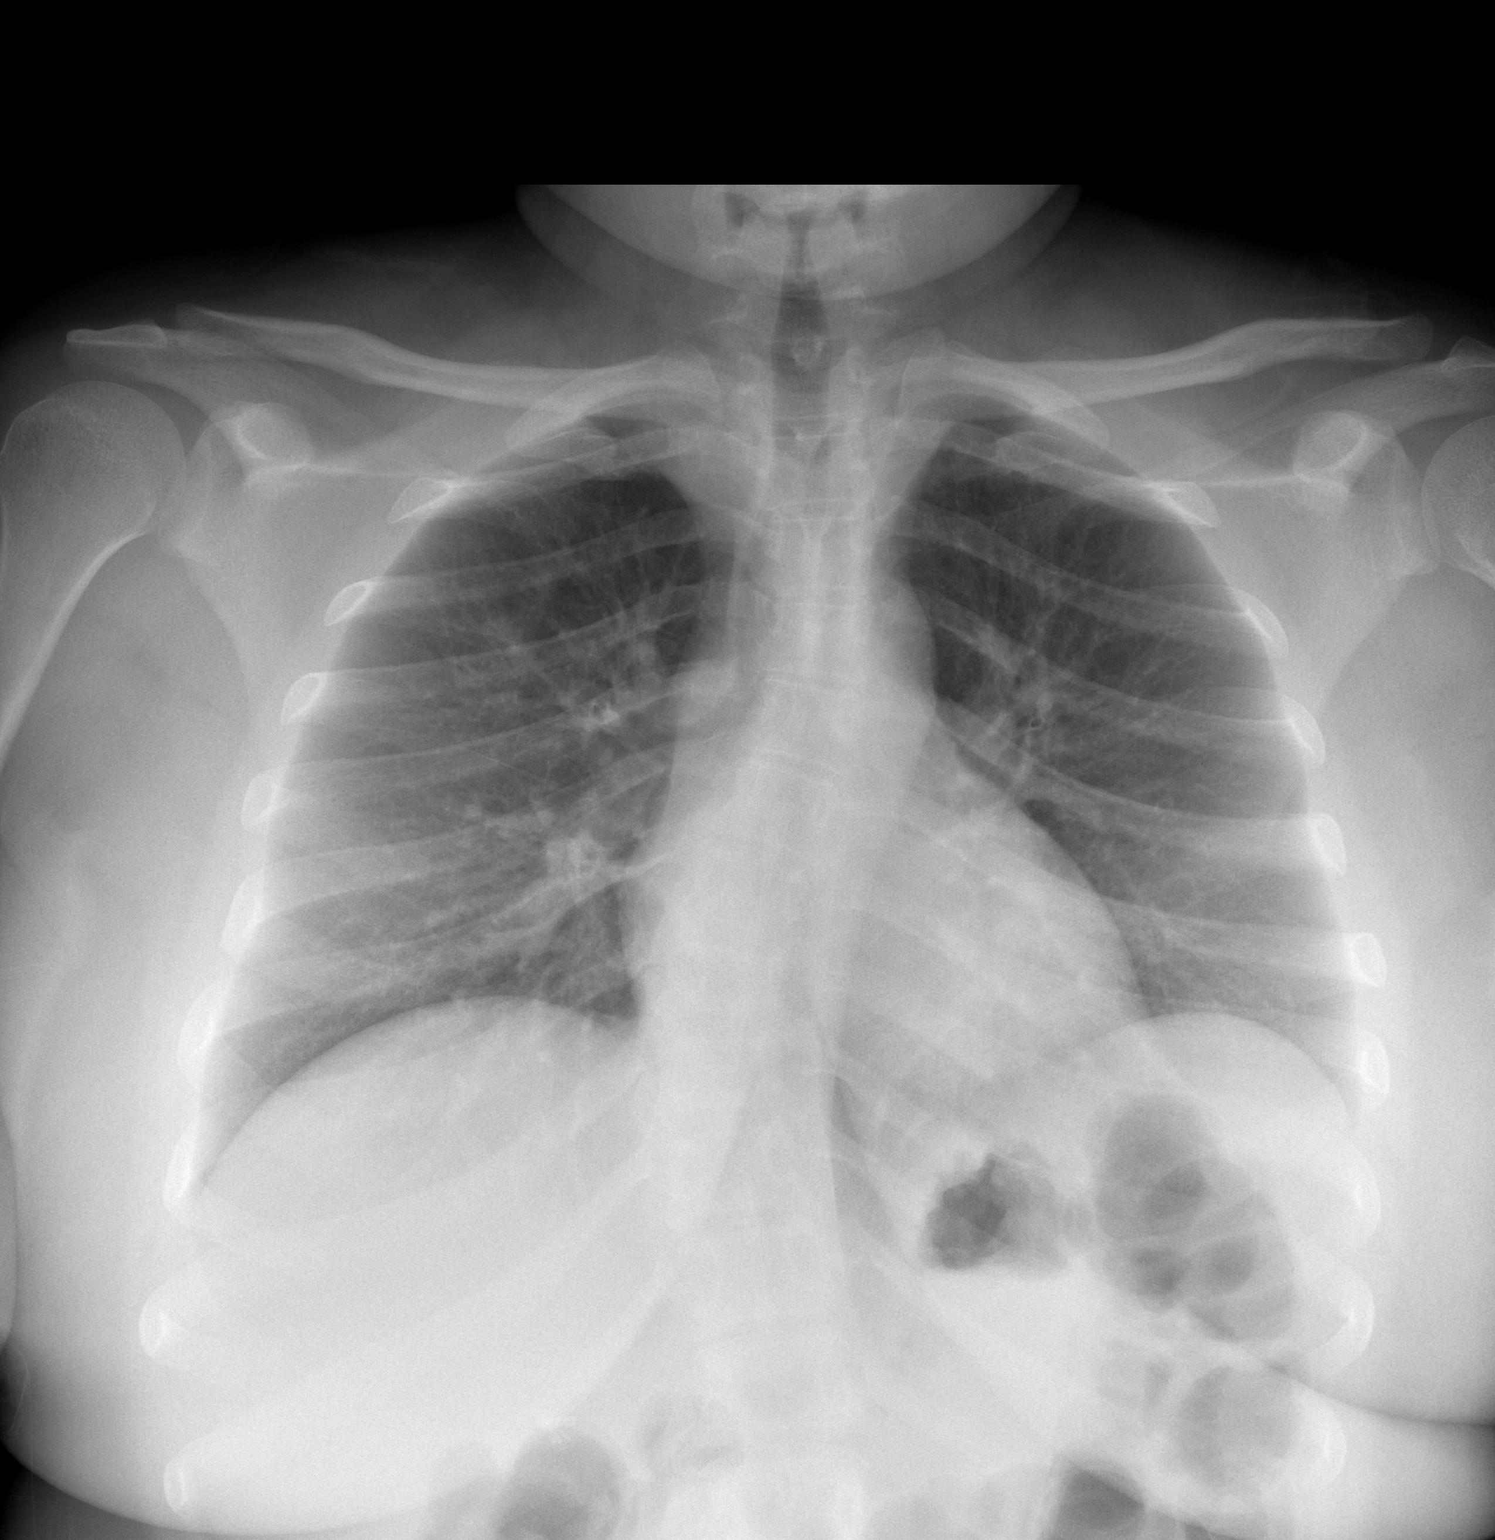

[w chest lat]
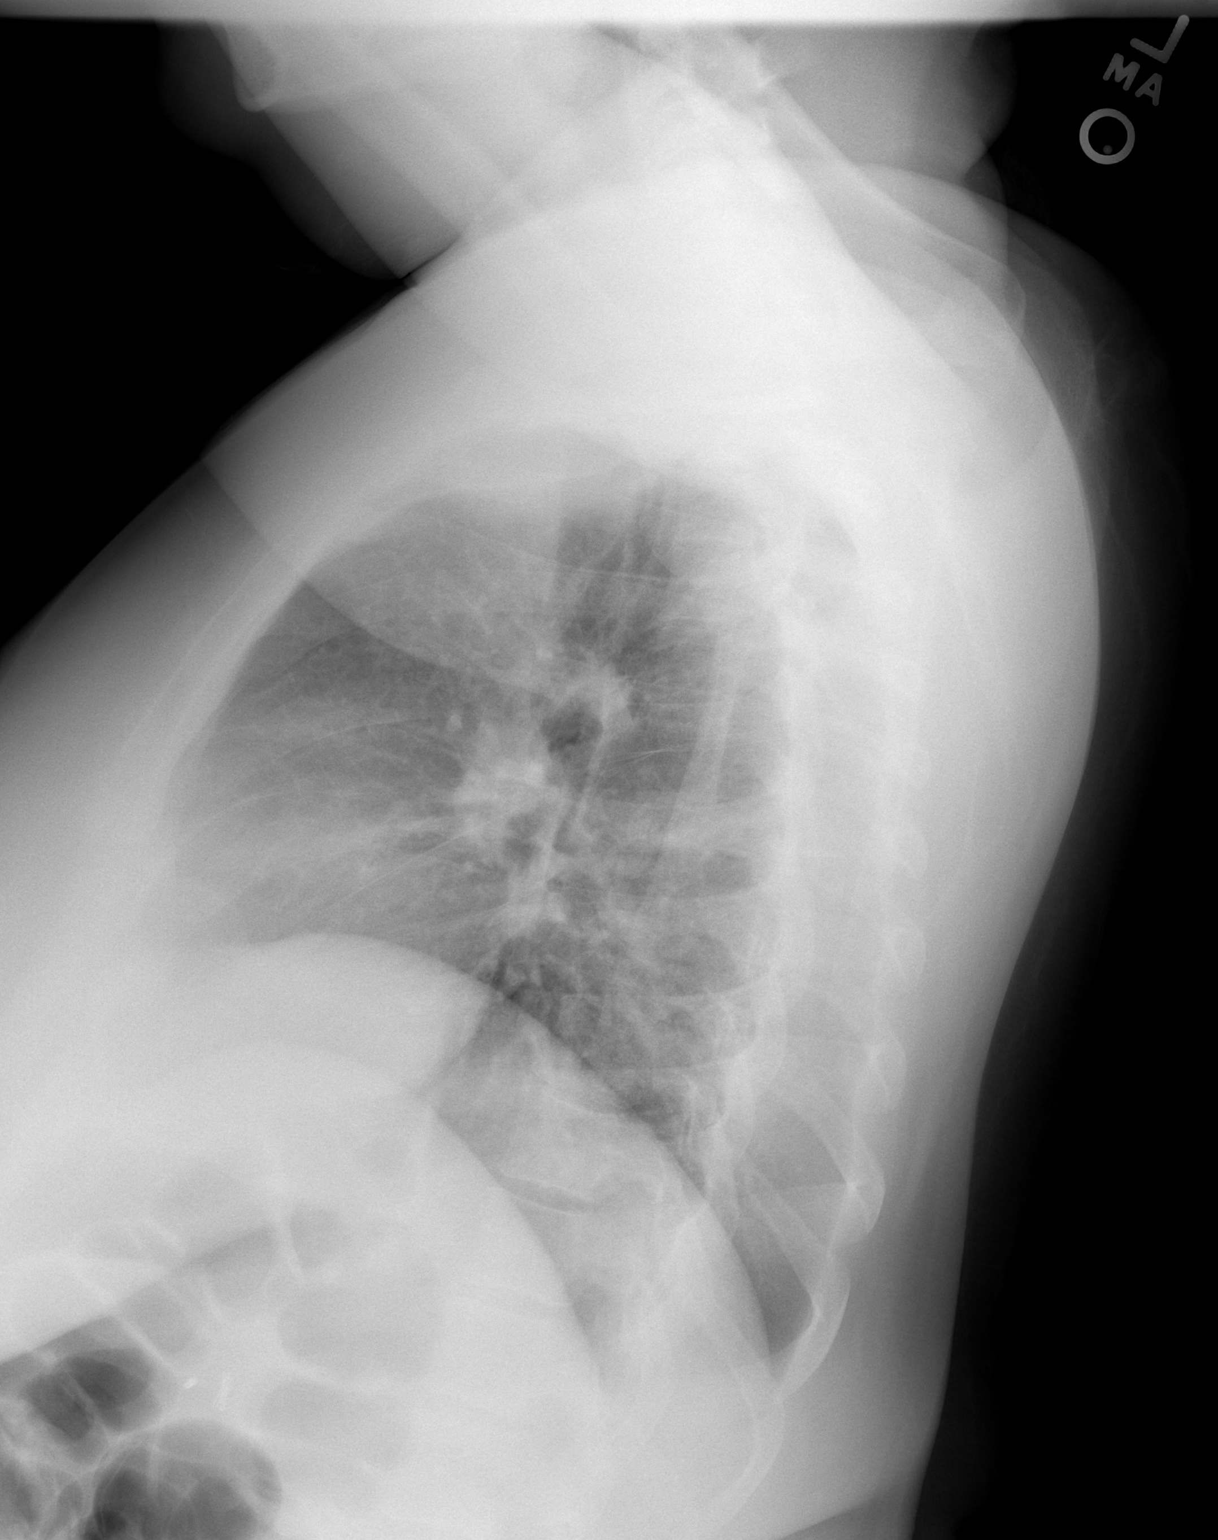

[2 of 2 positions shown; findings below may reference images not displayed]

FINDINGS: The cardiomediastinal contours are normal. Central bronchitic
change, right greater than left. Pulmonary vasculature is normal. No
consolidation, pleural effusion, or pneumothorax. Stable scoliotic
curvature of the midthoracic spine. No acute osseous abnormalities
are seen.
IMPRESSION: Central bronchitic change, right greater than left, may reflect
acute bronchitis. No consolidation.

## 2017-07-11 ENCOUNTER — Emergency Department (HOSPITAL_BASED_OUTPATIENT_CLINIC_OR_DEPARTMENT_OTHER)
Admission: EM | Admit: 2017-07-11 | Discharge: 2017-07-11 | Disposition: A | Discharge status: Home / Self Care | Payer: No Typology Code available for payment source | Attending: Emergency Medicine | Admitting: Emergency Medicine

## 2017-07-11 ENCOUNTER — Encounter (HOSPITAL_BASED_OUTPATIENT_CLINIC_OR_DEPARTMENT_OTHER): Payer: Self-pay

## 2017-07-11 DIAGNOSIS — R109 Unspecified abdominal pain: Secondary | ICD-10-CM | POA: Insufficient documentation

## 2017-07-11 DIAGNOSIS — R112 Nausea with vomiting, unspecified: Secondary | ICD-10-CM | POA: Insufficient documentation

## 2017-07-11 DIAGNOSIS — R197 Diarrhea, unspecified: Secondary | ICD-10-CM | POA: Diagnosis not present

## 2017-07-11 LAB — CBC
HCT: 40.1 % (ref 36.0–46.0)
Hemoglobin: 13.8 g/dL (ref 12.0–15.0)
MCH: 30.3 pg (ref 26.0–34.0)
MCHC: 34.4 g/dL (ref 30.0–36.0)
MCV: 88.1 fL (ref 78.0–100.0)
PLATELETS: 269 10*3/uL (ref 150–400)
RBC: 4.55 MIL/uL (ref 3.87–5.11)
RDW: 13.1 % (ref 11.5–15.5)
WBC: 12.3 10*3/uL — ABNORMAL HIGH (ref 4.0–10.5)

## 2017-07-11 LAB — URINALYSIS, ROUTINE W REFLEX MICROSCOPIC
BILIRUBIN URINE: NEGATIVE
Glucose, UA: NEGATIVE mg/dL
Ketones, ur: NEGATIVE mg/dL
Leukocytes, UA: NEGATIVE
NITRITE: NEGATIVE
Protein, ur: 30 mg/dL — AB
pH: 5.5 (ref 5.0–8.0)

## 2017-07-11 LAB — COMPREHENSIVE METABOLIC PANEL
ALBUMIN: 3.8 g/dL (ref 3.5–5.0)
ALT: 20 U/L (ref 14–54)
AST: 19 U/L (ref 15–41)
Alkaline Phosphatase: 61 U/L (ref 38–126)
Anion gap: 9 (ref 5–15)
BUN: 10 mg/dL (ref 6–20)
CHLORIDE: 105 mmol/L (ref 101–111)
CO2: 22 mmol/L (ref 22–32)
CREATININE: 0.99 mg/dL (ref 0.44–1.00)
Calcium: 8.9 mg/dL (ref 8.9–10.3)
GFR calc Af Amer: >60 mL/min (ref >60)
GFR calc non Af Amer: >60 mL/min (ref >60)
Glucose, Bld: 92 mg/dL (ref 65–99)
Potassium: 3.7 mmol/L (ref 3.5–5.1)
Sodium: 136 mmol/L (ref 135–145)
Total Bilirubin: 0.8 mg/dL (ref 0.3–1.2)
Total Protein: 7.8 g/dL (ref 6.5–8.1)

## 2017-07-11 LAB — URINALYSIS, MICROSCOPIC (REFLEX)

## 2017-07-11 MED ORDER — ONDANSETRON HCL 4 MG/2ML IJ SOLN
4.0000 mg | Freq: Once | INTRAMUSCULAR | Status: AC
  Administered 2017-07-11: 4 mg via INTRAVENOUS
  Filled 2017-07-11: qty 2

## 2017-07-11 MED ORDER — ACETAMINOPHEN 500 MG PO TABS
1000.0000 mg | ORAL_TABLET | Freq: Once | ORAL | Status: AC
  Administered 2017-07-11: 1000 mg via ORAL
  Filled 2017-07-11: qty 2

## 2017-07-11 MED ORDER — IBUPROFEN 400 MG PO TABS
600.0000 mg | ORAL_TABLET | Freq: Once | ORAL | Status: AC
  Administered 2017-07-11: 14:00:00 600 mg via ORAL
  Filled 2017-07-11: qty 1

## 2017-07-11 MED ORDER — ONDANSETRON HCL 4 MG PO TABS: 4.0000 mg | ORAL_TABLET | Freq: Three times a day (TID) | ORAL | 0 refills | Status: AC | PRN

## 2017-07-11 MED ORDER — SODIUM CHLORIDE 0.9 % IV BOLUS (SEPSIS)
1000.0000 mL | Freq: Once | INTRAVENOUS | Status: AC
  Administered 2017-07-11: 1000 mL via INTRAVENOUS

## 2017-07-11 NOTE — ED Notes (Signed)
Patient tolerating PO fluids without difficulty.

## 2017-07-11 NOTE — ED Provider Notes (Signed)
[facility] Provider Note   CSN: [id number] Arrival date & time: 07/11/17  1223     History   Chief Complaint Chief Complaint  Patient presents with  . Abdominal Pain    HPI Cheryl Washington is a 31 y.o. female presenting with nausea, vomiting, and diarrhea.  Patient states that yesterday afternoon, she started to have some nausea. She subsequently has had persistent vomiting and diarrhea. Her emesis is nonbloody and nonbilious. Diarrhea is nonbloody. No one else at home is sick. She does not have any recent travel. She reports abdominal pain only while vomiting. Patient states she has been unable to tolerate by mouth since yesterday morning. She reports a headache beginning today, and dry mouth. She denies fever, chills, chest pain, shortness of breath, or urinary symptoms. She has not taken anything for her headache today. She denies chronic abdominal problems including IBS, Crohn's, diverticulitis, or UC. She has had a laparoscopic cholecystectomy, but no other abdominal surgeries.  HPI  History reviewed. No pertinent past medical history.  There are no active problems to display for this patient.   Past Surgical History:  Procedure Laterality Date  . CHOLECYSTECTOMY      OB History    No data available       Home Medications    Prior to Admission medications   Medication Sig Start Date End Date Taking? Authorizing Provider  ondansetron (ZOFRAN) 4 MG tablet Take 1 tablet (4 mg total) by mouth every 8 (eight) hours as needed for nausea or vomiting. 07/11/17   Kailoni Vahle, PA-C    Family History No family history on file.  Social History Social History  Substance Use Topics  . Smoking status: Never Smoker  . Smokeless tobacco: Never Used  . Alcohol use No     Allergies   Patient has no known allergies.   Review of Systems Review of Systems  Constitutional: Negative for chills and fever.  HENT: Negative for congestion and sore throat.     Respiratory: Negative for cough, chest tightness and shortness of breath.   Cardiovascular: Negative for chest pain.  Gastrointestinal: Positive for diarrhea, nausea and vomiting. Negative for abdominal pain.  Genitourinary: Negative for dysuria, frequency and hematuria.  Musculoskeletal: Negative for back pain and neck pain.  Skin: Negative for rash.  Allergic/Immunologic: Negative for immunocompromised state.  Neurological: Positive for headaches. Negative for dizziness, weakness and numbness.  Hematological: Does not bruise/bleed easily.  Psychiatric/Behavioral: Negative for confusion.     Physical Exam Updated Vital Signs BP 106/60 (BP Location: Right Arm)   Pulse (!) 111   Temp 100.2 F (37.9 C) (Oral)   Resp 18   Ht  (1.499 m)   Wt 88.5 kg (195 lb)   SpO2 97%   BMI 39.39 kg/m   Physical Exam  Constitutional: She is oriented to person, place, and time. She appears well-developed and well-nourished. No distress.  HENT:  Head: Normocephalic and atraumatic.  Nose: Nose normal.  Mouth/Throat: Uvula is midline and oropharynx is clear and moist. Mucous membranes are dry.  Eyes: Pupils are equal, round, and reactive to light. EOM are normal.  Neck: Normal range of motion. Neck supple.  Cardiovascular: Regular rhythm and intact distal pulses.  Tachycardia present.   Pulmonary/Chest: Effort normal and breath sounds normal. No respiratory distress. She has no decreased breath sounds. She has no wheezes. She has no rhonchi. She has no rales.  Abdominal: Soft. Bowel sounds are normal. She exhibits no distension. There is  no tenderness. There is no rigidity, no rebound, no guarding, no CVA tenderness, no tenderness at McBurney's point and negative Murphy's sign.  No tenderness to palpation of abdomen. No rigidity, guarding, or rebound.  Musculoskeletal: Normal range of motion.  Lymphadenopathy:    She has no cervical adenopathy.  Neurological: She is alert and oriented to  person, place, and time.  Skin: Skin is warm and dry. No pallor.  Psychiatric: She has a normal mood and affect.  Nursing note and vitals reviewed.    ED Treatments / Results  Labs (all labs ordered are listed, but only abnormal results are displayed) Labs Reviewed  CBC - Abnormal; Notable for the following:       Result Value   WBC 12.3 (*)    All other components within normal limits  URINALYSIS, ROUTINE W REFLEX MICROSCOPIC - Abnormal; Notable for the following:    APPearance HAZY (*)    Specific Gravity, Urine >1.030 (*)    Hgb urine dipstick SMALL (*)    Protein, ur 30 (*)    All other components within normal limits  URINALYSIS, MICROSCOPIC (REFLEX) - Abnormal; Notable for the following:    Bacteria, UA FEW (*)    Squamous Epithelial / LPF 0-5 (*)    All other components within normal limits  COMPREHENSIVE METABOLIC PANEL    EKG  EKG Interpretation None       Radiology No results found.  Procedures Procedures (including critical care time)  Medications Ordered in ED Medications  sodium chloride 0.9 % bolus 1,000 mL (0 mLs Intravenous Stopped 07/11/17 1427)  ondansetron (ZOFRAN) injection 4 mg (4 mg Intravenous Given 07/11/17 1305)  acetaminophen (TYLENOL) tablet 1,000 mg (1,000 mg Oral Given 07/11/17 1305)  ibuprofen (ADVIL,MOTRIN) tablet 600 mg (600 mg Oral Given 07/11/17 1403)     Initial Impression / Assessment and Plan / ED Course  I have reviewed the triage vital signs and the nursing notes.  Pertinent labs & imaging results that were available during my care of the patient were reviewed by me and considered in my medical decision making (see chart for details).     Patient presenting with 1 day of nausea vomiting and diarrhea. Physical exam shows patient is tachycardic and febrile. No tenderness to palpation of abdomen. Likely tachycardic due to fever and dehydration. Will order basic abdominal labs, and UA. Will start IV fluids, give zofran, and  tylenol.   Difficulty starting fluids. patient reporting continued headache despite Tylenol. Will give dose of ibuprofen.  CMP reassuring, as patient without blood joint abnormality. UA negative for UTI, and shows dehydration. CBC shows slight leukocytosis at 12.3. Fever improved with Tylenol, heart rate improved with fluids. Headache improved. Discussed option of scanning abdomen to ensure no intra-abdominal infection. At this time, patient does not want to have CT. At this time, doubt appendicitis, other intra-abdominal infection, obstruction, perforation, or peritonitis. Pt without abd pain, and sxs improved with IVF. Discussed importance of staying well hydrated. By mouth challenge successful. Will give Zofran for nausea or vomiting. At this time, patient appears safe for discharge. Strict return precautions given, patient states she understands that if she has abdominal pain, or feels worse, she must return to the emergency room to rule out appendicitis.   Final Clinical Impressions(s) / ED Diagnoses   Final diagnoses:  Nausea vomiting and diarrhea    New Prescriptions Discharge Medication List as of 07/11/2017  2:54 PM    START taking these medications   Details  ondansetron (ZOFRAN) 4 MG tablet Take 1 tablet (4 mg total) by mouth every 8 (eight) hours as needed for nausea or vomiting., Starting Wed 07/11/2017, Print         Dahlgrenaccavale, HubbardstonSophia, PA-C 07/11/17 1746    Melene PlanFloyd, Dan, DO 07/12/17 1548

## 2017-07-11 NOTE — ED Notes (Signed)
Patient given water for PO challenge.  

## 2017-07-11 NOTE — Discharge Instructions (Signed)
It is very important that you stay well-hydrated. Drink lots of fluids. Use Zofran as needed for nausea or vomiting. You will likely have symptoms for several more days. It is very important that you return to the emergency room if you develop persistent abdominal pain, especially in the right lower area. If you feel like you are getting worse, you must come back to the emergency room to make sure you do not have appendicitis.

## 2017-07-11 NOTE — ED Notes (Signed)
ED Provider at bedside. 

## 2017-07-11 NOTE — ED Triage Notes (Signed)
C/o abd pain, n/v/d x 2 days-NAD-steady gait

## 2017-07-12 ENCOUNTER — Emergency Department (HOSPITAL_BASED_OUTPATIENT_CLINIC_OR_DEPARTMENT_OTHER)
Admission: EM | Admit: 2017-07-12 | Discharge: 2017-07-12 | Disposition: A | Discharge status: Home / Self Care | Payer: No Typology Code available for payment source | Attending: Emergency Medicine | Admitting: Emergency Medicine

## 2017-07-12 ENCOUNTER — Encounter (HOSPITAL_BASED_OUTPATIENT_CLINIC_OR_DEPARTMENT_OTHER): Payer: Self-pay

## 2017-07-12 DIAGNOSIS — R197 Diarrhea, unspecified: Secondary | ICD-10-CM | POA: Insufficient documentation

## 2017-07-12 DIAGNOSIS — R112 Nausea with vomiting, unspecified: Secondary | ICD-10-CM | POA: Insufficient documentation

## 2017-07-12 DIAGNOSIS — A09 Infectious gastroenteritis and colitis, unspecified: Secondary | ICD-10-CM

## 2017-07-12 DIAGNOSIS — E876 Hypokalemia: Secondary | ICD-10-CM | POA: Diagnosis not present

## 2017-07-12 LAB — CBC WITH DIFFERENTIAL/PLATELET
Basophils Absolute: 0 10*3/uL (ref 0.0–0.1)
Basophils Relative: 0 %
EOS ABS: 0 10*3/uL (ref 0.0–0.7)
EOS PCT: 0 %
HCT: 38.1 % (ref 36.0–46.0)
Hemoglobin: 13.3 g/dL (ref 12.0–15.0)
LYMPHS PCT: 13 %
Lymphs Abs: 0.9 10*3/uL (ref 0.7–4.0)
MCH: 30.3 pg (ref 26.0–34.0)
MCHC: 34.9 g/dL (ref 30.0–36.0)
MCV: 86.8 fL (ref 78.0–100.0)
MONOS PCT: 8 %
Monocytes Absolute: 0.6 10*3/uL (ref 0.1–1.0)
Neutro Abs: 5.5 10*3/uL (ref 1.7–7.7)
Neutrophils Relative %: 78 %
PLATELETS: 239 10*3/uL (ref 150–400)
RBC: 4.39 MIL/uL (ref 3.87–5.11)
RDW: 12.9 % (ref 11.5–15.5)
WBC: 7.1 10*3/uL (ref 4.0–10.5)

## 2017-07-12 LAB — COMPREHENSIVE METABOLIC PANEL
ALT: 22 U/L (ref 14–54)
ANION GAP: 9 (ref 5–15)
AST: 21 U/L (ref 15–41)
Albumin: 3.3 g/dL — ABNORMAL LOW (ref 3.5–5.0)
Alkaline Phosphatase: 52 U/L (ref 38–126)
BUN: 10 mg/dL (ref 6–20)
CHLORIDE: 105 mmol/L (ref 101–111)
CO2: 20 mmol/L — ABNORMAL LOW (ref 22–32)
Calcium: 8.3 mg/dL — ABNORMAL LOW (ref 8.9–10.3)
Creatinine, Ser: 0.94 mg/dL (ref 0.44–1.00)
Glucose, Bld: 99 mg/dL (ref 65–99)
Potassium: 2.8 mmol/L — ABNORMAL LOW (ref 3.5–5.1)
Sodium: 134 mmol/L — ABNORMAL LOW (ref 135–145)
Total Bilirubin: 0.9 mg/dL (ref 0.3–1.2)
Total Protein: 6.8 g/dL (ref 6.5–8.1)

## 2017-07-12 LAB — I-STAT CG4 LACTIC ACID, ED: LACTIC ACID, VENOUS: 1.03 mmol/L (ref 0.5–1.9)

## 2017-07-12 LAB — C DIFFICILE QUICK SCREEN W PCR REFLEX
C DIFFICILE (CDIFF) INTERP: NOT DETECTED
C Diff antigen: NEGATIVE
C Diff toxin: NEGATIVE

## 2017-07-12 LAB — LIPASE, BLOOD: LIPASE: 19 U/L (ref 11–51)

## 2017-07-12 MED ORDER — SODIUM CHLORIDE 0.9 % IV BOLUS (SEPSIS)
1000.0000 mL | Freq: Once | INTRAVENOUS | Status: AC
  Administered 2017-07-12: 1000 mL via INTRAVENOUS

## 2017-07-12 MED ORDER — POTASSIUM CHLORIDE CRYS ER 20 MEQ PO TBCR
40.0000 meq | EXTENDED_RELEASE_TABLET | Freq: Once | ORAL | Status: AC
  Administered 2017-07-12: 40 meq via ORAL
  Filled 2017-07-12: qty 2

## 2017-07-12 MED ORDER — IBUPROFEN 800 MG PO TABS
ORAL_TABLET | ORAL | Status: AC
  Administered 2017-07-12: 800 mg
  Filled 2017-07-12: qty 1

## 2017-07-12 MED ORDER — ONDANSETRON 8 MG PO TBDP: 8.0000 mg | ORAL_TABLET | Freq: Three times a day (TID) | ORAL | 0 refills | Status: AC | PRN

## 2017-07-12 MED ORDER — DICYCLOMINE HCL 10 MG PO CAPS
10.0000 mg | ORAL_CAPSULE | Freq: Once | ORAL | Status: AC
Start: 2017-07-12 — End: 2017-07-12
  Administered 2017-07-12: 10 mg via ORAL
  Filled 2017-07-12: qty 1

## 2017-07-12 MED ORDER — DIPHENOXYLATE-ATROPINE 2.5-0.025 MG PO TABS
2.0000 | ORAL_TABLET | Freq: Once | ORAL | Status: AC
  Administered 2017-07-12: 2 via ORAL
  Filled 2017-07-12: qty 2

## 2017-07-12 MED ORDER — POTASSIUM CHLORIDE 10 MEQ/100ML IV SOLN
10.0000 meq | INTRAVENOUS | Status: AC
  Administered 2017-07-12 (×2): 10 meq via INTRAVENOUS
  Filled 2017-07-12 (×2): qty 100

## 2017-07-12 MED ORDER — POTASSIUM CHLORIDE CRYS ER 20 MEQ PO TBCR: 20.0000 meq | EXTENDED_RELEASE_TABLET | Freq: Two times a day (BID) | ORAL | 0 refills | Status: AC

## 2017-07-12 MED ORDER — METOCLOPRAMIDE HCL 5 MG/ML IJ SOLN
10.0000 mg | Freq: Once | INTRAMUSCULAR | Status: AC
  Administered 2017-07-12: 10 mg via INTRAVENOUS
  Filled 2017-07-12: qty 2

## 2017-07-12 MED ORDER — LOPERAMIDE HCL 2 MG PO CAPS: 2.0000 mg | ORAL_CAPSULE | Freq: Four times a day (QID) | ORAL | 0 refills | Status: AC | PRN

## 2017-07-12 MED ORDER — MAGNESIUM SULFATE 2 GM/50ML IV SOLN
2.0000 g | Freq: Once | INTRAVENOUS | Status: AC
  Administered 2017-07-12: 2 g via INTRAVENOUS
  Filled 2017-07-12: qty 50

## 2017-07-12 MED ORDER — METRONIDAZOLE 500 MG PO TABS: 500.0000 mg | ORAL_TABLET | Freq: Three times a day (TID) | ORAL | 0 refills | Status: AC

## 2017-07-12 NOTE — ED Notes (Signed)
Blood culture x1 drawn and held at this time.

## 2017-07-12 NOTE — ED Provider Notes (Signed)
Pt signed out at shift change. Pt with watery diarrhea, fever, for several days. Patient was febrile upon arrival, temperature 103.3. She was found to be hypokalemic. Magnesium and potassium ordered. IV fluids ordered. Patient has a no abdominal pain. Stool cultures on as well.  Patient signed out pending reassessment.   9:28 PM Patient's heart rate did improve with IV fluids but still around 100. She however feels much better. I examined her abdomen, her abdomen is nontender, soft, no guarding or rebound tenderness. She states she feels well. She has been drinking fluids and talking on the phone the entire time. She states she would like to go home. I will discharge her home with Zofran, potassium, and will go ahead and start her on Flagyl. Patient had greater than 20 episodes of diarrhea while in emergency department. We discussed that her cultures and Clostridium difficile cultures are pending and if they come back abnormal she will be notified. Also discussed that she would need to come back if she is feeling worse. Discussed increasing fluid intake and she can take Imodium for her diarrhea.  Vitals:   07/12/17 1608 07/12/17 1752 07/12/17 1930 07/12/17 2100  BP:  103/69 107/61 131/77  Pulse: (!) 111 (!) 104 (!) 102 (!) 103  Resp: 16 16 18 18   Temp:  98.4 F (36.9 C)    TempSrc:  Oral    SpO2: 100% 100% 100% 100%  Weight:      Height:          Jaynie CrumbleKirichenko, Simrin Vegh, PA-C 07/13/17 [id number]    Alvira MondaySchlossman, Erin, MD 07/18/17 1311

## 2017-07-12 NOTE — ED Notes (Signed)
Will give Magnesium after potassium complete

## 2017-07-12 NOTE — ED Notes (Signed)
Pt reports she has had BM x 3 since arrival to room 4

## 2017-07-12 NOTE — ED Notes (Signed)
Pt provided a stool specimen, but accidentally urinated in it. Sample discarded and pt instructed to collect again.

## 2017-07-12 NOTE — ED Notes (Addendum)
ED Provider at bedside. Pt given 800mg  ibuprofen per VORB from MicanopyHarris, EDPA at bedside. Temp 103.3

## 2017-07-12 NOTE — ED Provider Notes (Signed)
[facility] Provider Note   CSN: [id number] Arrival date & time: 07/12/17  1205     History   Chief Complaint Chief Complaint  Patient presents with  . Vomiting    HPI Cheryl Washington is a 31 y.o. female who presents emergency Department with chief complaint of vomiting nausea and diarrhea. Patient was seen yesterday for the same and diagnosed with viral illness. She states that she was unable to obtain her Zofran yesterday and has had persistent vomiting and diarrhea since that time. Patient also noted to be febrile and took 2 325 mg Tylenols prior to arrival in the emergency department. She states that she feels weak and tired. She complains of generalized achiness. She is having 1-2 watery brown stools every hour and has had several episodes of nonbloody nonbilious vomiting. She has been able to hold down fluids, applesauce and saltine crackers. She denies contacts with similar symptoms, recent antibiotic use.  HPI  History reviewed. No pertinent past medical history.  There are no active problems to display for this patient.   Past Surgical History:  Procedure Laterality Date  . CHOLECYSTECTOMY      OB History    No data available       Home Medications    Prior to Admission medications   Medication Sig Start Date End Date Taking? Authorizing Provider  ondansetron (ZOFRAN) 4 MG tablet Take 1 tablet (4 mg total) by mouth every 8 (eight) hours as needed for nausea or vomiting. 07/11/17   Caccavale, Sophia, PA-C    Family History No family history on file.  Social History Social History  Substance Use Topics  . Smoking status: Never Smoker  . Smokeless tobacco: Never Used  . Alcohol use No     Allergies   Patient has no known allergies.   Review of Systems Review of Systems   Physical Exam Updated Vital Signs BP 121/85 (BP Location: Left Arm)   Pulse (!) 129   Temp (!) 100.6 F (38.1 C) (Oral)   Resp 18   Ht 4\' 11"  (1.499 m)   Wt 88.9  kg (196 lb)   SpO2 96%   BMI 39.59 kg/m   Physical Exam  Constitutional: She is oriented to person, place, and time. She appears well-developed and well-nourished. No distress.  HENT:  Head: Normocephalic and atraumatic.  Eyes: Pupils are equal, round, and reactive to light. Conjunctivae and EOM are normal. No scleral icterus.  Neck: Normal range of motion.  Cardiovascular: Regular rhythm and normal heart sounds.  Exam reveals no gallop and no friction rub.   No murmur heard. tachycardic  Pulmonary/Chest: Effort normal and breath sounds normal. No respiratory distress.  Abdominal: Soft. Bowel sounds are normal. She exhibits no distension and no mass. There is no tenderness. There is no guarding.  Neurological: She is alert and oriented to person, place, and time.  Skin: Skin is warm and dry. She is not diaphoretic.  Psychiatric: Her behavior is normal.  Nursing note and vitals reviewed.    ED Treatments / Results  Labs (all labs ordered are listed, but only abnormal results are displayed) Labs Reviewed  STOOL CULTURE  C DIFFICILE QUICK SCREEN W PCR REFLEX  CBC WITH DIFFERENTIAL/PLATELET  COMPREHENSIVE METABOLIC PANEL  LIPASE, BLOOD  I-STAT CG4 LACTIC ACID, ED    EKG  EKG Interpretation None       Radiology No results found.  Procedures Procedures (including critical care time)  Medications Ordered in ED Medications  sodium  chloride 0.9 % bolus 1,000 mL (not administered)  metoCLOPramide (REGLAN) injection 10 mg (not administered)  dicyclomine (BENTYL) capsule 10 mg (not administered)  ibuprofen (ADVIL,MOTRIN) 800 MG tablet (800 mg  Given 07/12/17 1400)     Initial Impression / Assessment and Plan / ED Course  I have reviewed the triage vital signs and the nursing notes.  Pertinent labs & imaging results that were available during my care of the patient were reviewed by me and considered in my medical decision making (see chart for details).  Clinical  Course as of Jul 15 1631  Thu Jul 12, 2017  1552 Potassium: (!) 2.8 [AH]  1553 Sodium: (!) 134 [AH]  1553 Patient with potassium loss, likely due to her diarrhea. I have ordered PO potassium and IV potassium + magnesium. CO2: (!) 20 [AH]  1553 Pulse continues to be elevated. Her Fever has improved. Pulse Rate: (!) 117 [AH]    Clinical Course User Index [AH] Arthor Captain, PA-C    Patient with diarrhea, dehydration and hypokalemia.  She receiving fluids and potassium. Tolerating PO. I have given sign out to PA Kirichenko who will assume care. Final Clinical Impressions(s) / ED Diagnoses   Final diagnoses:  Diarrhea, infectious, adult  Hypokalemia    New Prescriptions New Prescriptions   No medications on file     Arthor Captain, PA-C 07/14/17 1636    Little, Ambrose Finland, MD 07/14/17 2133

## 2017-07-12 NOTE — ED Triage Notes (Signed)
C/o n/v/d x 2 days-seen here for same yesterday-states she feels no better-NAD-steady gait

## 2017-07-12 NOTE — Discharge Instructions (Signed)
Take Flagyl as prescribed for possible infectious diarrhea. Take Tylenol or Motrin for fever. Drink plenty of fluids. Take potassium as prescribed until all gone to increase your blood potassium level. Take Imodium as needed for diarrhea. If your cultures come back abnormal, we will call you. He can also find results in "my chart." Return if you're feeling worse.

## 2017-07-12 NOTE — ED Notes (Signed)
Did not get zofran rx filled yesterday-last tylenol 2 hours PTA

## 2017-07-17 NOTE — ED Notes (Signed)
Pt. Called  This RN's office and left message for return call to discuss results of stool specimen. Called provided phone number, no answer, unable to leave a message. Cher Nakai , RN 07/17/2017

## 2017-07-18 LAB — STOOL CULTURE: E coli, Shiga toxin Assay: NEGATIVE

## 2017-07-18 LAB — STOOL CULTURE REFLEX - RSASHR

## 2017-07-18 LAB — STOOL CULTURE REFLEX - CMPCXR
